# Patient Record
Sex: Male | Born: 1965 | Race: White | Hispanic: No
Health system: Southern US, Community
[De-identification: ages and names within clinical notes are randomized; demographics above are authoritative.]

## PROBLEM LIST (undated history)

## (undated) DIAGNOSIS — I10 Essential (primary) hypertension: Secondary | ICD-10-CM

## (undated) HISTORY — PX: APPENDECTOMY: SHX54

---

## 2020-12-04 ENCOUNTER — Emergency Department (HOSPITAL_COMMUNITY)
Admission: EM | Admit: 2020-12-04 | Discharge: 2020-12-04 | Disposition: A | Discharge status: Home / Self Care | Payer: No Typology Code available for payment source | Attending: Emergency Medicine | Admitting: Emergency Medicine

## 2020-12-04 ENCOUNTER — Encounter (HOSPITAL_COMMUNITY): Payer: Self-pay

## 2020-12-04 ENCOUNTER — Other Ambulatory Visit: Payer: Self-pay

## 2020-12-04 ENCOUNTER — Emergency Department (HOSPITAL_COMMUNITY): Payer: No Typology Code available for payment source

## 2020-12-04 DIAGNOSIS — R42 Dizziness and giddiness: Secondary | ICD-10-CM | POA: Insufficient documentation

## 2020-12-04 DIAGNOSIS — R55 Syncope and collapse: Secondary | ICD-10-CM | POA: Insufficient documentation

## 2020-12-04 DIAGNOSIS — Z7982 Long term (current) use of aspirin: Secondary | ICD-10-CM | POA: Insufficient documentation

## 2020-12-04 DIAGNOSIS — I10 Essential (primary) hypertension: Secondary | ICD-10-CM | POA: Insufficient documentation

## 2020-12-04 DIAGNOSIS — Z79899 Other long term (current) drug therapy: Secondary | ICD-10-CM | POA: Diagnosis not present

## 2020-12-04 DIAGNOSIS — R531 Weakness: Secondary | ICD-10-CM | POA: Diagnosis not present

## 2020-12-04 DIAGNOSIS — R519 Headache, unspecified: Secondary | ICD-10-CM

## 2020-12-04 DIAGNOSIS — T679XXA Effect of heat and light, unspecified, initial encounter: Secondary | ICD-10-CM | POA: Diagnosis not present

## 2020-12-04 HISTORY — DX: Essential (primary) hypertension: I10

## 2020-12-04 LAB — CBC WITH DIFFERENTIAL/PLATELET
Abs Immature Granulocytes: 0.02 10*3/uL (ref 0.00–0.07)
Basophils Absolute: 0.1 10*3/uL (ref 0.0–0.1)
Basophils Relative: 1 %
Eosinophils Absolute: 0.3 10*3/uL (ref 0.0–0.5)
Eosinophils Relative: 5 %
HCT: 44.8 % (ref 39.0–52.0)
Hemoglobin: 14.7 g/dL (ref 13.0–17.0)
Immature Granulocytes: 0 %
Lymphocytes Relative: 16 %
Lymphs Abs: 1.1 10*3/uL (ref 0.7–4.0)
MCH: 26 pg (ref 26.0–34.0)
MCHC: 32.8 g/dL (ref 30.0–36.0)
MCV: 79.2 fL — ABNORMAL LOW (ref 80.0–100.0)
Monocytes Absolute: 0.5 10*3/uL (ref 0.1–1.0)
Monocytes Relative: 7 %
Neutro Abs: 4.9 10*3/uL (ref 1.7–7.7)
Neutrophils Relative %: 71 %
Platelets: 236 10*3/uL (ref 150–400)
RBC: 5.66 MIL/uL (ref 4.22–5.81)
RDW: 14 % (ref 11.5–15.5)
WBC: 6.9 10*3/uL (ref 4.0–10.5)
nRBC: 0 % (ref 0.0–0.2)

## 2020-12-04 LAB — COMPREHENSIVE METABOLIC PANEL
ALT: 29 U/L (ref 0–44)
AST: 24 U/L (ref 15–41)
Albumin: 4.1 g/dL (ref 3.5–5.0)
Alkaline Phosphatase: 88 U/L (ref 38–126)
Anion gap: 10 (ref 5–15)
BUN: 14 mg/dL (ref 6–20)
CO2: 23 mmol/L (ref 22–32)
Calcium: 9.4 mg/dL (ref 8.9–10.3)
Chloride: 107 mmol/L (ref 98–111)
Creatinine, Ser: 1.13 mg/dL (ref 0.61–1.24)
GFR, Estimated: >60 mL/min (ref >60)
Glucose, Bld: 112 mg/dL — ABNORMAL HIGH (ref 70–99)
Potassium: 3.8 mmol/L (ref 3.5–5.1)
Sodium: 140 mmol/L (ref 135–145)
Total Bilirubin: 1.5 mg/dL — ABNORMAL HIGH (ref 0.3–1.2)
Total Protein: 7 g/dL (ref 6.5–8.1)

## 2020-12-04 LAB — TROPONIN I (HIGH SENSITIVITY)
Troponin I (High Sensitivity): 8 ng/L (ref <18)
Troponin I (High Sensitivity): 14 ng/L (ref <18)

## 2020-12-04 LAB — CK: Total CK: 119 U/L (ref 49–397)

## 2020-12-04 MED ORDER — DIPHENHYDRAMINE HCL 50 MG/ML IJ SOLN
25.0000 mg | Freq: Once | INTRAMUSCULAR | Status: AC
  Administered 2020-12-04: 25 mg via INTRAVENOUS
  Filled 2020-12-04: qty 1

## 2020-12-04 MED ORDER — METOCLOPRAMIDE HCL 5 MG/ML IJ SOLN
5.0000 mg | Freq: Once | INTRAMUSCULAR | Status: AC
  Administered 2020-12-04: 5 mg via INTRAVENOUS
  Filled 2020-12-04: qty 2

## 2020-12-04 MED ORDER — ACETAMINOPHEN 325 MG PO TABS
650.0000 mg | ORAL_TABLET | Freq: Once | ORAL | Status: AC
  Administered 2020-12-04: 650 mg via ORAL
  Filled 2020-12-04: qty 2

## 2020-12-04 MED ORDER — SODIUM CHLORIDE 0.9 % IV BOLUS
1000.0000 mL | Freq: Once | INTRAVENOUS | Status: AC
  Administered 2020-12-04: 1000 mL via INTRAVENOUS

## 2020-12-04 MED ORDER — BUTALBITAL-APAP-CAFFEINE 50-325-40 MG PO TABS: 1.0000 | ORAL_TABLET | Freq: Four times a day (QID) | ORAL | 0 refills | Status: AC | PRN

## 2020-12-04 NOTE — ED Triage Notes (Signed)
Pt BIB [facility] c/o a headache, dizziness and some confusion. Pt is a [employer]. LKW was 1130. Since pt has had bouts of confusion and a splitting headache. Pt denies any other complaints. Pt received 450 ml of NS and 4mg  of zofran with EMS.

## 2020-12-04 NOTE — ED Provider Notes (Signed)
[facility] EMERGENCY DEPARTMENT Provider Note   CSN: [id number] Arrival date & time: 12/04/20  1549     History Chief Complaint  Patient presents with   Headache   Dizziness    Kevin Carrillo is a 55 y.o. male.  54 yo male with history as below presenting to ED for headache.  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  Exerting himself.  Began to experience headache at the superior portion of his head.  Headache was more gradual onset but reached maximal discomfort after approximately 2 to 3 minutes.  It has been intermittent since then.  Reports associated with difficulty seeing, lightheadedness/dizziness.  Diaphoresis.  Mild nausea without emesis.  He got into his vehicle with a air conditioning on, drink glass of water and the pain began to improve.  No LOC.  No numbness or tingling, no difficulty with ambulation.  No history of head trauma.  No neck pain or tightness.  No fevers.  No history IV drug use. Not anticoagulated   The history is provided by the patient. No language interpreter was used.  Headache Associated symptoms: dizziness and weakness   Associated symptoms: no abdominal pain, no cough, no fever, no nausea, no photophobia and no vomiting   Dizziness Associated symptoms: headaches and weakness   Associated symptoms: no chest pain, no nausea, no palpitations, no shortness of breath and no vomiting       Past Medical History:  Diagnosis Date   Hypertension     There are no problems to display for this patient.   Past Surgical History:  Procedure Laterality Date   APPENDECTOMY         History reviewed. No pertinent family history.     Home Medications Prior to Admission medications   Medication Sig Start Date End Date Taking? Authorizing Provider  Ascorbic Acid (VITAMIN C PO) Take 1 tablet by mouth daily.   Yes [provider]   Aspirin-Salicylamide-Caffeine (BC HEADACHE POWDER PO) Take 1 packet by mouth at bedtime as needed (knee pain, back pain).   Yes [provider]  butalbital-acetaminophen-caffeine (FIORICET) 50-325-40 MG tablet Take 1 tablet by mouth every 6 (six) hours as needed for headache. 12/04/20 12/04/21 Yes Tanda Rockers A, DO  hydrochlorothiazide (HYDRODIURIL) 25 MG tablet Take 25 mg by mouth daily. 09/27/20  Yes [provider]  ibuprofen (ADVIL) 800 MG tablet Take 800 mg by mouth every 8 (eight) hours as needed. 11/15/20  Yes [provider]  lisinopril (ZESTRIL) 20 MG tablet Take 20 mg by mouth 2 (two) times daily. 08/08/20  Yes [provider]  LYSINE PO Take 1 tablet by mouth daily.   Yes [provider]    Allergies    Patient has no known allergies.  Review of Systems   Review of Systems  Constitutional:  Negative for chills and fever.  HENT:  Negative for facial swelling and trouble swallowing.   Eyes:  Positive for visual disturbance. Negative for photophobia.  Respiratory:  Negative for cough and shortness of breath.   Cardiovascular:  Negative for chest pain and palpitations.  Gastrointestinal:  Negative for abdominal pain, nausea and vomiting.  Endocrine: Negative for polydipsia and polyuria.  Genitourinary:  Negative for difficulty urinating and hematuria.  Musculoskeletal:  Negative for gait problem and joint swelling.  Skin:  Negative for pallor and rash.  Neurological:  Positive for dizziness, weakness, light-headedness  and headaches. Negative for syncope.  Psychiatric/Behavioral:  Negative for agitation and confusion.    Physical Exam Updated Vital Signs BP 140/89   Pulse 79   Temp 98.4 F (36.9 C) (Oral)   Resp 16   Ht 6' (1.829 m)   Wt (!) 182.3 kg   SpO2 97%   BMI 54.52 kg/m   Physical Exam Vitals and nursing note reviewed.  Constitutional:      General: He is not in acute distress.    Appearance: He is well-developed.   HENT:     Head: Normocephalic and atraumatic.     Right Ear: External ear normal.     Left Ear: External ear normal.     Mouth/Throat:     Mouth: Mucous membranes are moist.  Eyes:     General: No scleral icterus. Cardiovascular:     Rate and Rhythm: Normal rate and regular rhythm.     Pulses: Normal pulses.     Heart sounds: Normal heart sounds.  Pulmonary:     Effort: Pulmonary effort is normal. No respiratory distress.     Breath sounds: Normal breath sounds.  Abdominal:     General: Abdomen is flat.     Palpations: Abdomen is soft.     Tenderness: There is no abdominal tenderness.  Musculoskeletal:        General: Normal range of motion.     Cervical back: Normal range of motion.     Right lower leg: No edema.     Left lower leg: No edema.  Skin:    General: Skin is warm and dry.     Capillary Refill: Capillary refill takes less than 2 seconds.  Neurological:     Mental Status: He is alert and oriented to person, place, and time.     GCS: GCS eye subscore is 4. GCS verbal subscore is 5. GCS motor subscore is 6.     Cranial Nerves: Cranial nerves are intact.     Sensory: Sensation is intact.     Motor: Motor function is intact.     Coordination: Coordination is intact. Finger-Nose-Finger Test normal.     Gait: Gait is intact.  Psychiatric:        Mood and Affect: Mood normal.        Behavior: Behavior normal.    ED Results / Procedures / Treatments   Labs (all labs ordered are listed, but only abnormal results are displayed) Labs Reviewed  CBC WITH DIFFERENTIAL/PLATELET - Abnormal; Notable for the following components:      Result Value   MCV 79.2 (*)    All other components within normal limits  COMPREHENSIVE METABOLIC PANEL - Abnormal; Notable for the following components:   Glucose, Bld 112 (*)    Total Bilirubin 1.5 (*)    All other components within normal limits  CK  TROPONIN I (HIGH SENSITIVITY)  TROPONIN I (HIGH SENSITIVITY)    EKG EKG  Interpretation  Date/Time:  Wednesday December 04 2020 15:52:52 EDT Ventricular Rate:  89 PR Interval:  169 QRS Duration: 89 QT Interval:  348 QTC Calculation: 424 R Axis:   53 Text Interpretation: Sinus rhythm Confirmed by Tanda Rockers ([id number]) on 12/04/2020 8:48:38 PM  Radiology CT HEAD WO CONTRAST ( )  Result Date: 12/04/2020 CLINICAL DATA:  Headache, new or worsening (Age >= 50y) EXAM: CT HEAD WITHOUT CONTRAST TECHNIQUE: Contiguous axial images were obtained from the base of the skull through the vertex without intravenous contrast. COMPARISON:  None. FINDINGS: Brain:  No acute intracranial abnormality. Specifically, no hemorrhage, hydrocephalus, mass lesion, acute infarction, or significant intracranial injury. Vascular: No hyperdense vessel or unexpected calcification. Skull: No acute calvarial abnormality. Sinuses/Orbits: No acute findings Other: None IMPRESSION: Normal study. Electronically Signed   By: Charlett Nose M.D.   On: 12/04/2020 18:18    Procedures Procedures   Medications Ordered in ED Medications  sodium chloride 0.9 % bolus 1,000 mL (0 mLs Intravenous Stopped 12/04/20 1853)  metoCLOPramide (REGLAN) injection 5 mg (5 mg Intravenous Given 12/04/20 1651)  diphenhydrAMINE (BENADRYL) injection 25 mg (25 mg Intravenous Given 12/04/20 1652)  acetaminophen (TYLENOL) tablet 650 mg (650 mg Oral Given 12/04/20 1650)    ED Course  I have reviewed the triage vital signs and the nursing notes.  Pertinent labs & imaging results that were available during my care of the patient were reviewed by me and considered in my medical decision making (see chart for details).    MDM Rules/Calculators/A&P                           55 year old male with presentation as above.  Acute headache.  Neurologic exam is nonfocal.  Vital signs are stable.  He is nontoxic-appearing.  Serious etiology considered.  Patient without history of prior headache, sudden onset nature.  Obtain CT head.  This is  stable.  ECG without acute ischemia, no STEMI.   Labs reviewed.  These are stable.  Given IV fluids and headache cocktail.  Symptoms have resolved.  Is ambulatory.  Tolerating p.o. intake.  Stable for discharge. F/u with PCP, advised to avoid known triggers, maintain oral intake. Headache likely provoked from exertion in the heat without adequate oral rehydration.      Patient presents with headache. Based on the patient's history and physical there is very low clinical suspicion for significant intracranial pathology. There are no neurologic findings on exam, the patient does not have a fever, the patient does not have any jaw claudication, the patient does not endorse a clotting disorder, patient denies any trauma or eye pain and the headache is not associated weakness on one side of the body, slurred speech, or ataxia. Given the extremely low risk of these diagnoses further testing and evaluation for these possibilities does not appear to be indicated at this time.  The patient improved significantly and was discharged in stable condition. Detailed discussions were had with the patient regarding current findings, and need for close f/u with PCP or on call doctor. The patient has been instructed to return immediately if the symptoms worsen in any way for re-evaluation. Patient verbalized understanding and is in agreement with current care plan. All questions answered prior to discharge.     Final Clinical Impression(s) / ED Diagnoses Final diagnoses:  Acute nonintractable headache, unspecified headache type  Heat effects, initial encounter  Near syncope    Rx / DC Orders ED Discharge Orders          Ordered    butalbital-acetaminophen-caffeine (FIORICET) 50-325-40 MG tablet  Every 6 hours PRN        12/04/20 2043             Sloan Leiter, DO 12/04/20 2049

## 2022-07-20 IMAGING — CT CT HEAD W/O CM
4 series · 15 of 47 positions shown, 17 images · non-contrast
Comparison: None.

CLINICAL DATA: Headache, new or worsening (Age >= 50y)

EXAM:
CT HEAD WITHOUT CONTRAST
TECHNIQUE: Contiguous axial images were obtained from the base of the skull
through the vertex without intravenous contrast.

[Series 3: head wo · axial · 0.45mm/px · z∈[-162,-42]mm · 7 of 33 slices shown, 9 images]
[im 5/33  brain]
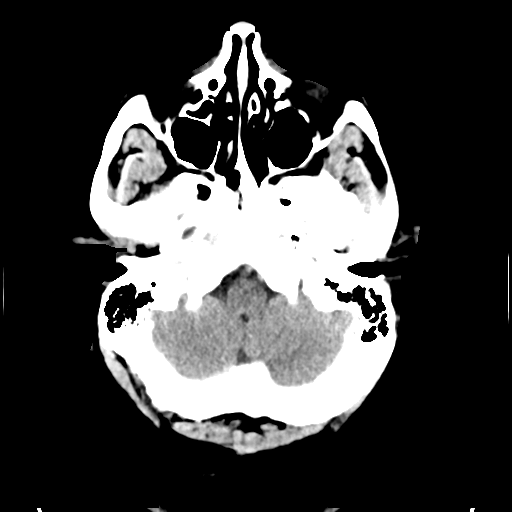
[im 5/33  bone]
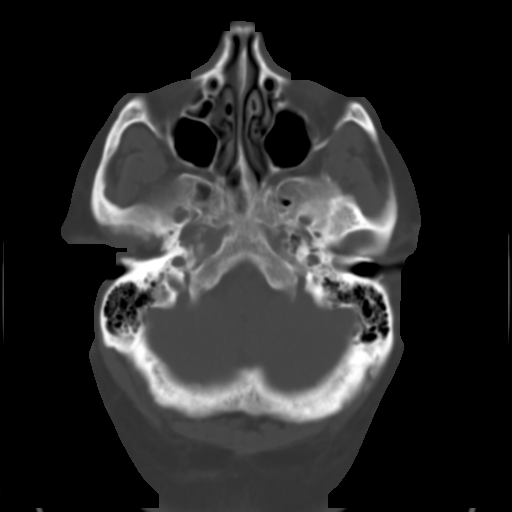
[im 9/33  brain]
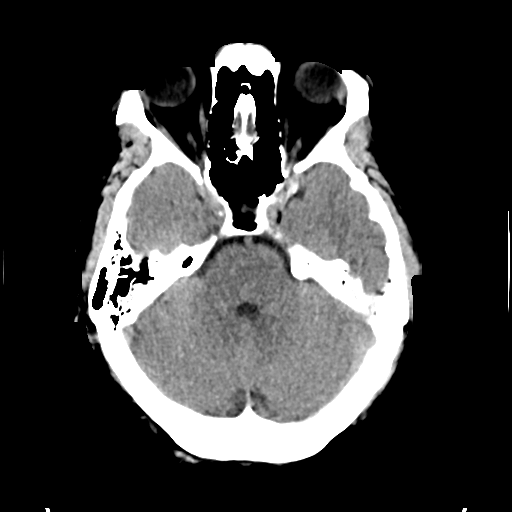
[im 13/33  brain]
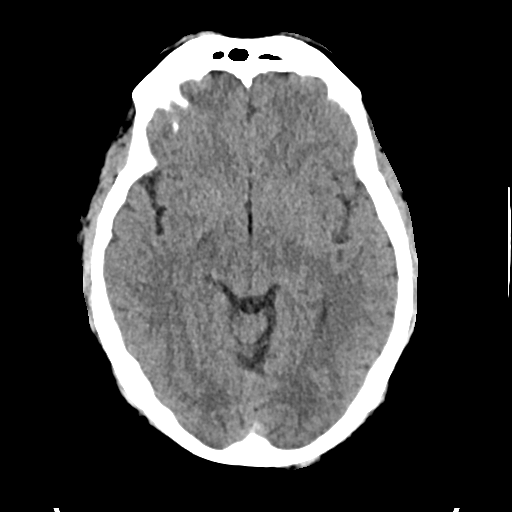
[im 17/33  brain]
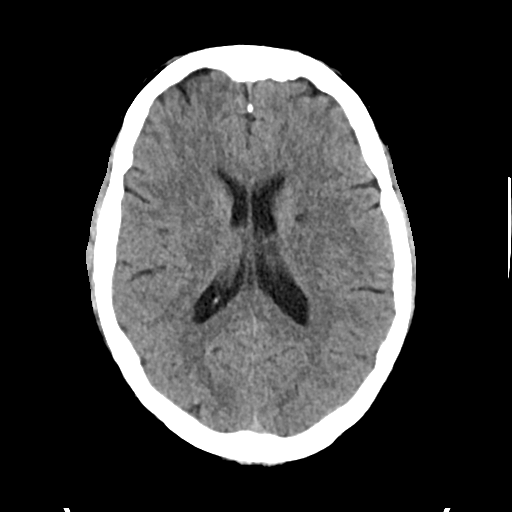
[im 21/33  brain]
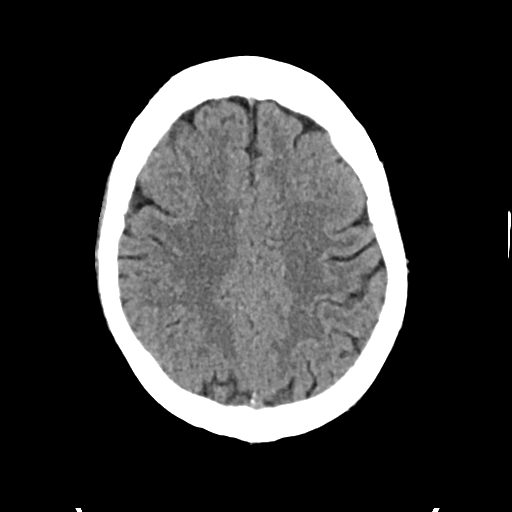
[im 21/33  bone]
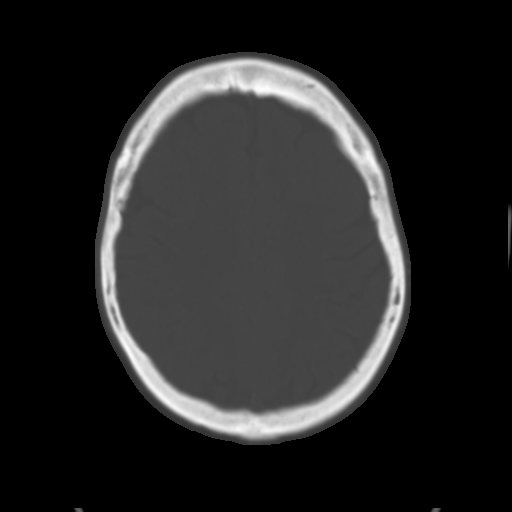
[im 25/33  brain]
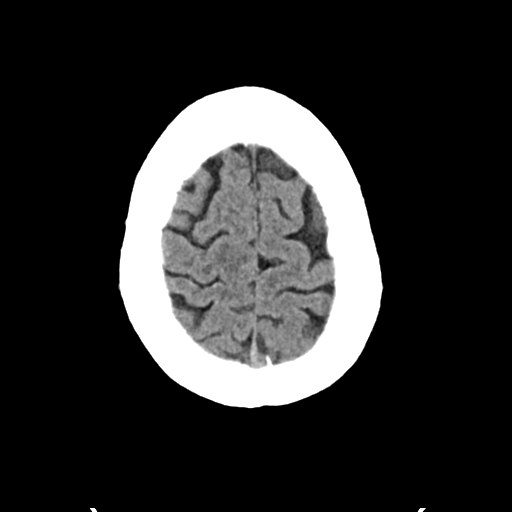
[im 29/33  brain]
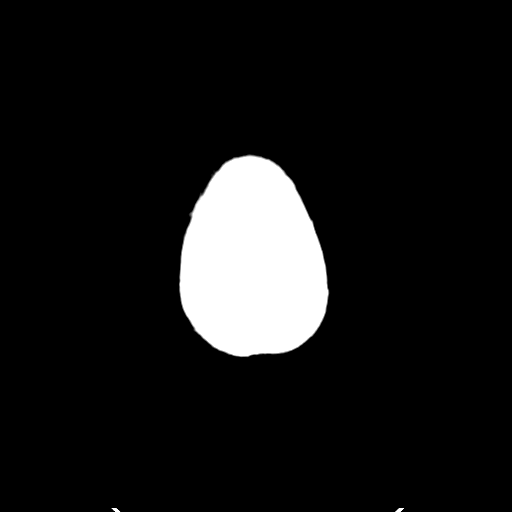

[Series 4: head bone · axial · 0.45mm/px · z∈[-166,-150]mm · 2 of 81 slices shown]
[im 9/81  bone]
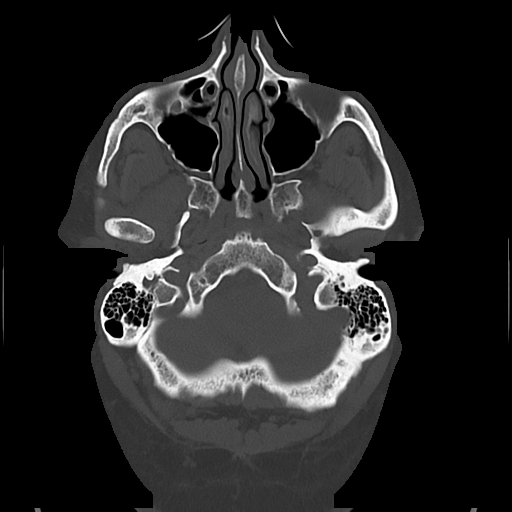
[im 17/81  bone]
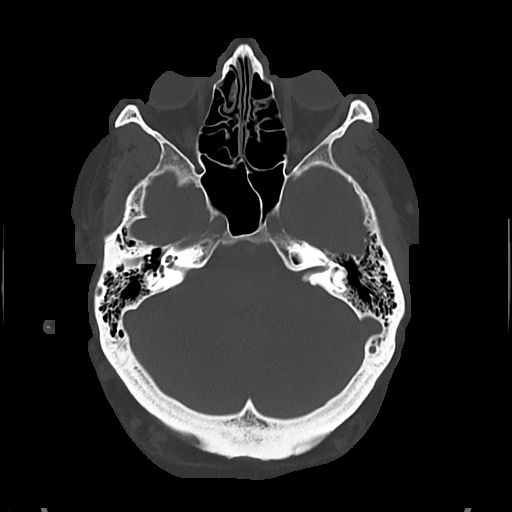

[Series 5: cor soft · coronal · 0.29mm/px · 3 of 69 slices shown]
[im 23/69  brain]
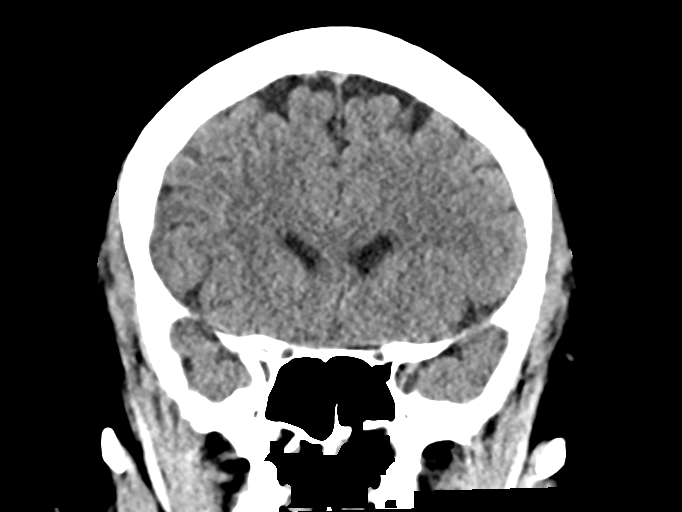
[im 31/69  brain]
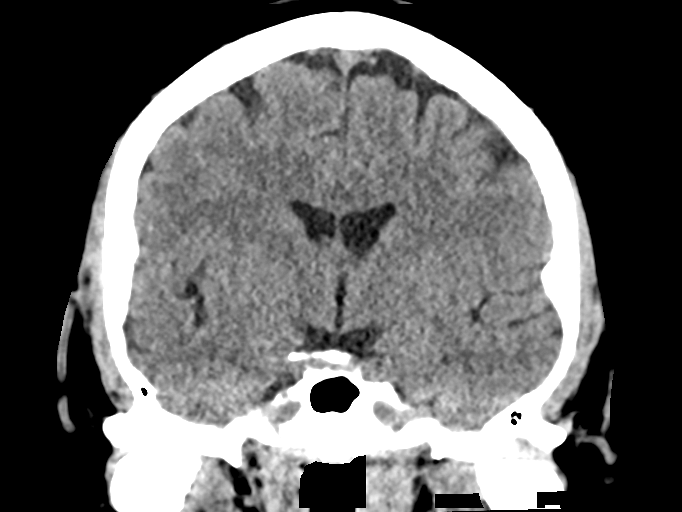
[im 38/69  brain]
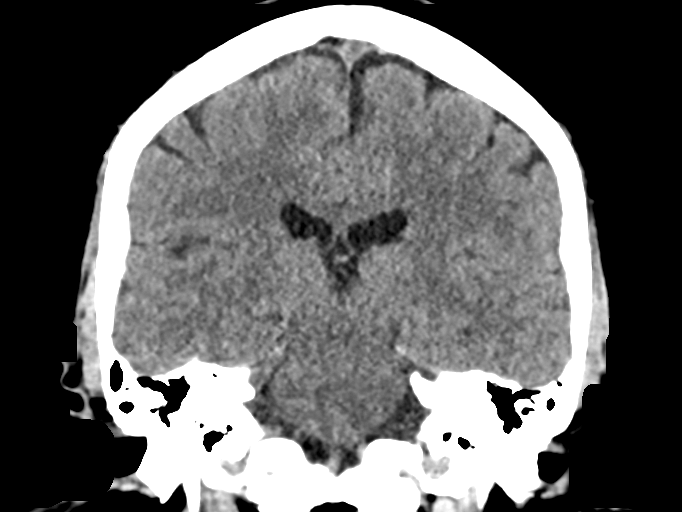

[Series 6: sag soft · sagittal · 0.29mm/px · 3 of 55 slices shown]
[im 19/55  brain]
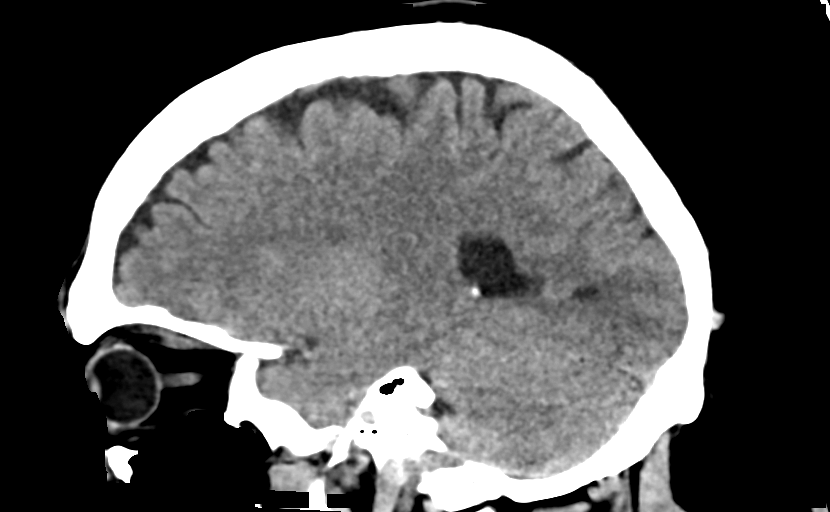
[im 28/55  brain]
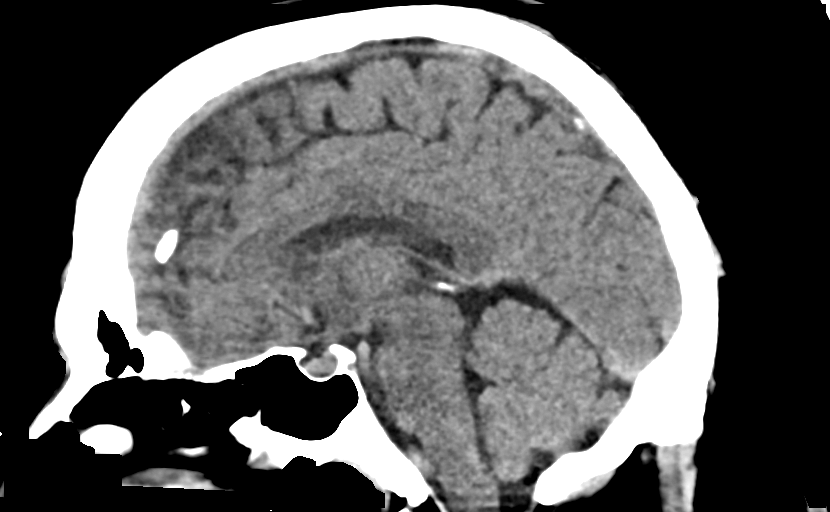
[im 37/55  brain]
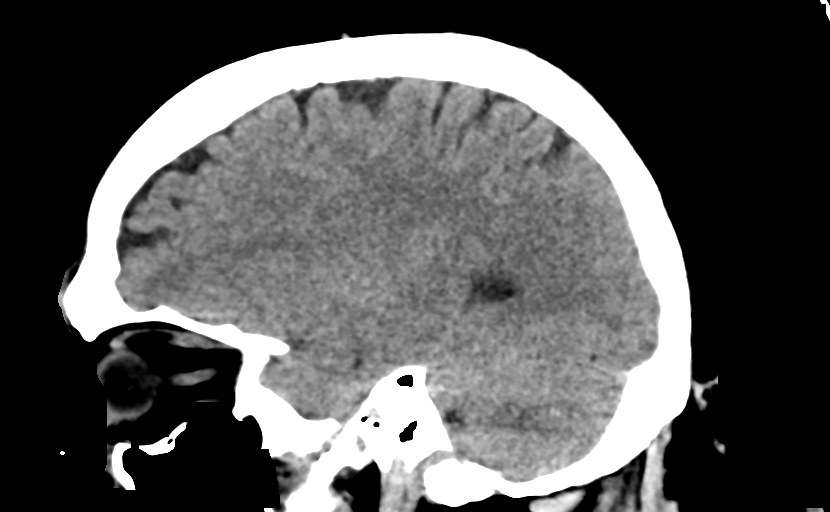

[15 of 47 positions shown; findings below may reference images not displayed]

FINDINGS: Brain: No acute intracranial abnormality. Specifically, no
hemorrhage, hydrocephalus, mass lesion, acute infarction, or
significant intracranial injury.

Vascular: No hyperdense vessel or unexpected calcification.

Skull: No acute calvarial abnormality.

Sinuses/Orbits: No acute findings

Other: None
IMPRESSION: Normal study.
